# Patient Record
Sex: Male | Born: 1955 | Race: White | Hispanic: No | Marital: Married
Health system: Southern US, Community
[De-identification: ages and names within clinical notes are randomized; demographics above are authoritative.]

---

## 2010-01-23 ENCOUNTER — Ambulatory Visit: Payer: Self-pay

## 2013-03-10 ENCOUNTER — Emergency Department: Payer: Self-pay | Admitting: Emergency Medicine

## 2013-03-10 LAB — CBC
HCT: 47.9 % (ref 40.0–52.0)
MCHC: 34.5 g/dL (ref 32.0–36.0)
RBC: 5.07 10*6/uL (ref 4.40–5.90)
RDW: 13.8 % (ref 11.5–14.5)
WBC: 10.6 10*3/uL (ref 3.8–10.6)

## 2013-03-10 LAB — BASIC METABOLIC PANEL
Anion Gap: 9 (ref 7–16)
BUN: 11 mg/dL (ref 7–18)
Chloride: 100 mmol/L (ref 98–107)
Creatinine: 1.11 mg/dL (ref 0.60–1.30)
EGFR (African American): >60
EGFR (Non-African Amer.): >60
Osmolality: 270 (ref 275–301)
Sodium: 134 mmol/L — ABNORMAL LOW (ref 136–145)

## 2013-03-10 LAB — TROPONIN I
Troponin-I: <0.02 ng/mL
Troponin-I: <0.02 ng/mL

## 2013-03-10 LAB — CK TOTAL AND CKMB (NOT AT ARMC): CK, Total: 98 U/L (ref 35–232)

## 2013-05-10 ENCOUNTER — Emergency Department: Payer: Self-pay | Admitting: Emergency Medicine

## 2013-05-10 LAB — BASIC METABOLIC PANEL
Anion Gap: 12 (ref 7–16)
BUN: 10 mg/dL (ref 7–18)
EGFR (Non-African Amer.): >60
Glucose: 130 mg/dL — ABNORMAL HIGH (ref 65–99)
Potassium: 3.1 mmol/L — ABNORMAL LOW (ref 3.5–5.1)

## 2013-05-10 LAB — CBC
MCH: 32.1 pg (ref 26.0–34.0)
Platelet: 268 10*3/uL (ref 150–440)
RDW: 13.1 % (ref 11.5–14.5)
WBC: 11.7 10*3/uL — ABNORMAL HIGH (ref 3.8–10.6)

## 2013-05-10 LAB — TROPONIN I: Troponin-I: <0.02 ng/mL

## 2014-02-16 IMAGING — CR DG CHEST 2V
1 series · 3 of 3 positions shown · non-contrast
Comparison: none

REASON FOR EXAM: Chest Pain
COMMENTS:

[Series 1: pa · 0.17mm/px · 3 of 3 slices shown]
[im 1/3]
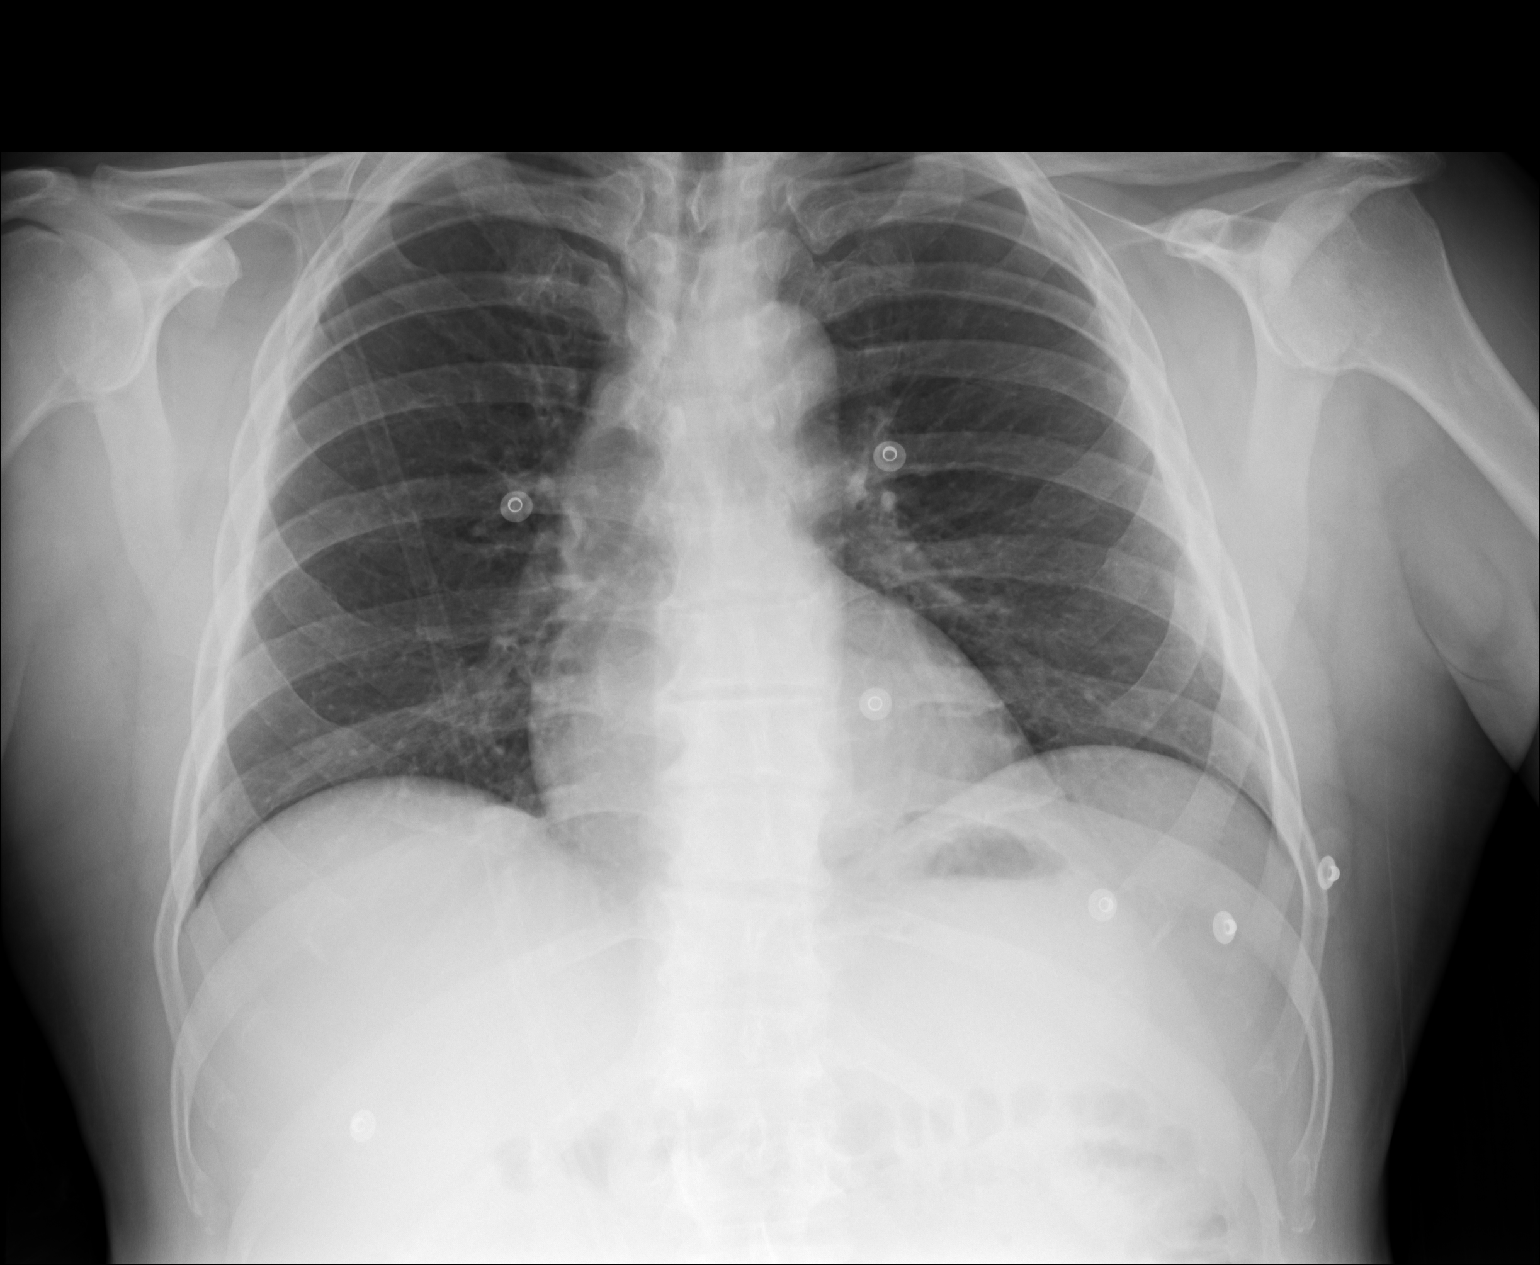
[im 2/3]
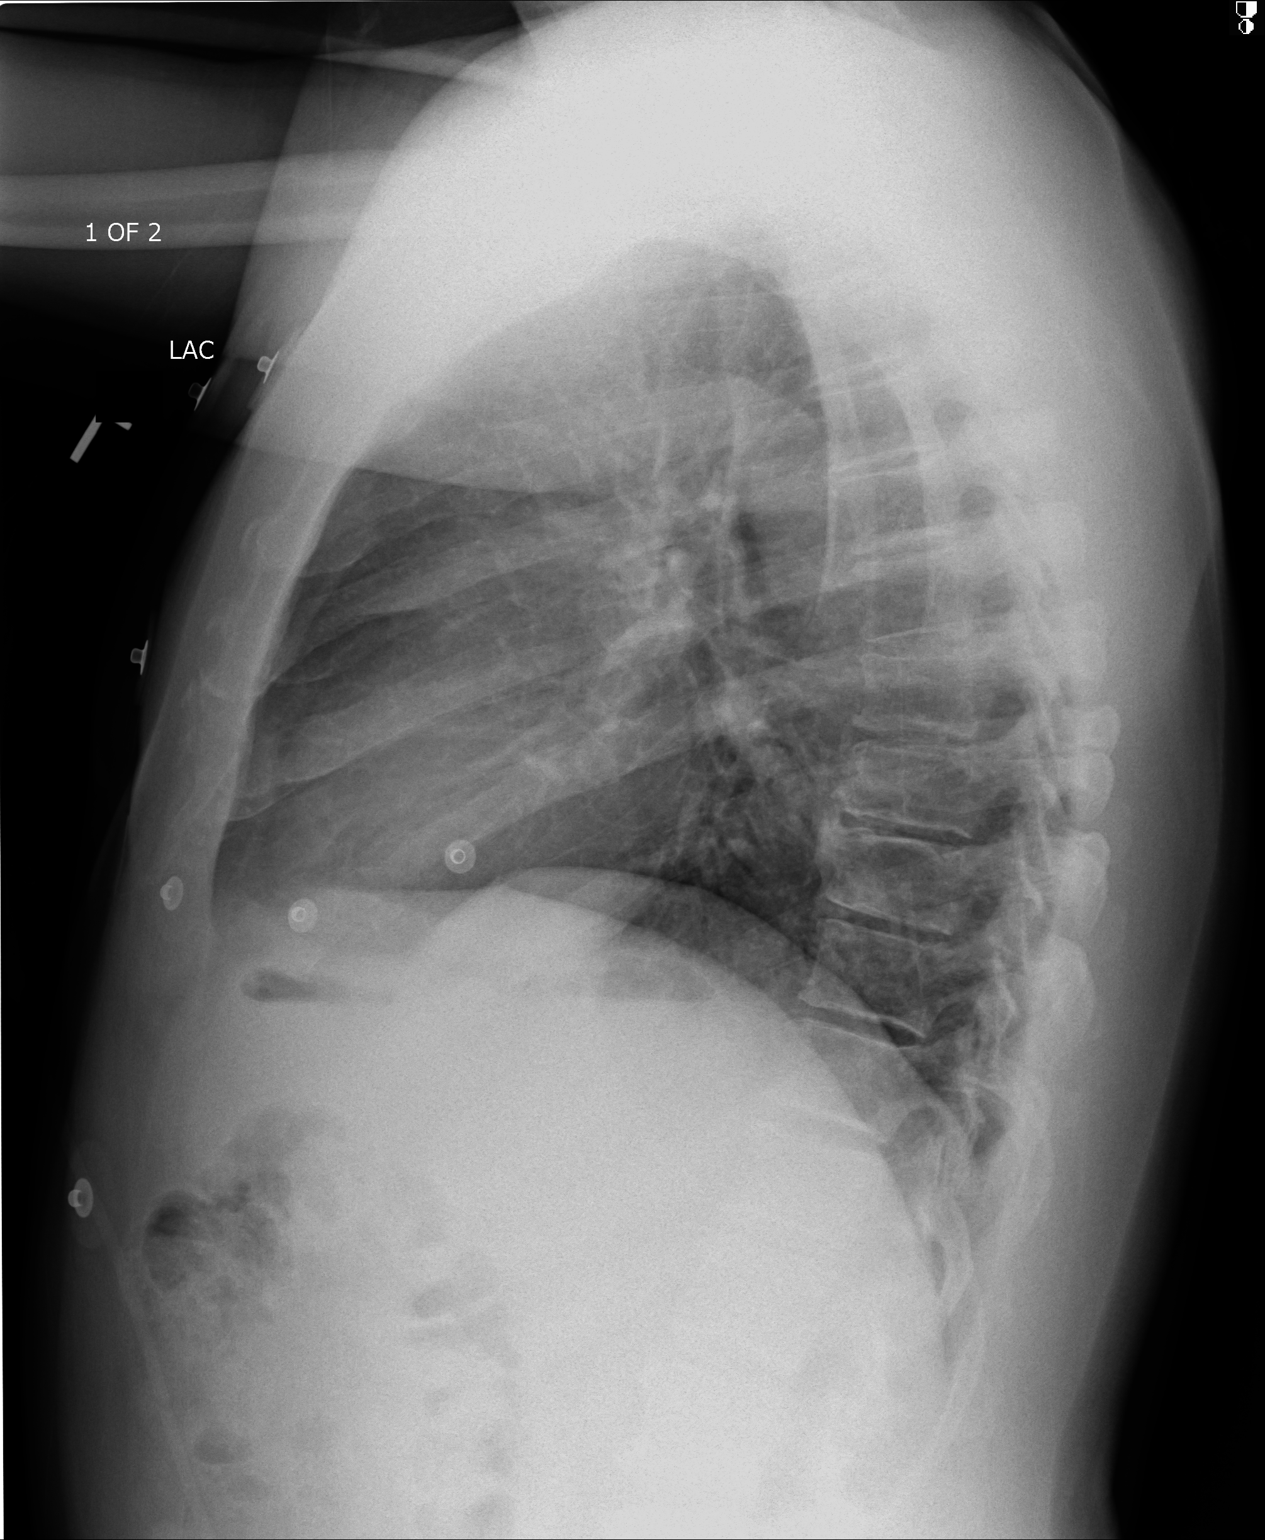
[im 3/3]
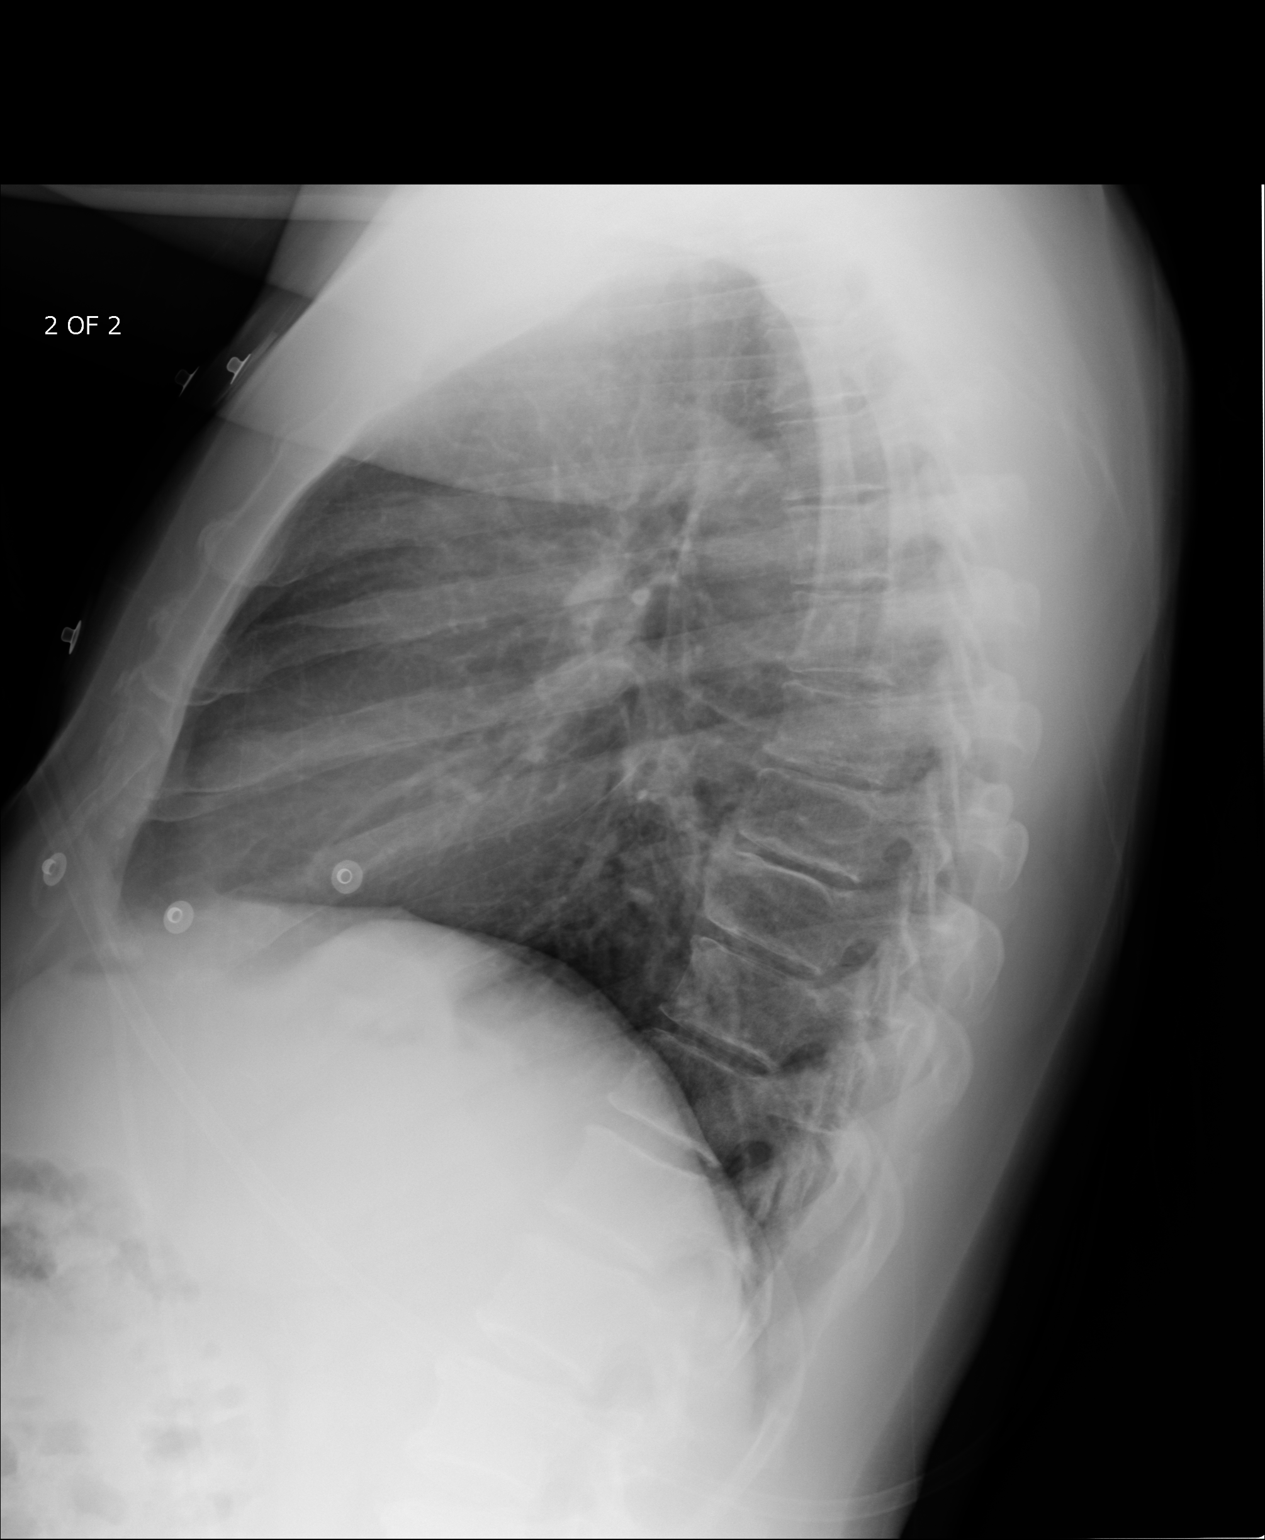

[3 of 3 positions shown; findings below may reference images not displayed]

PROCEDURE:     DXR - DXR CHEST PA (OR AP) AND LATERAL  - March 10, 2013  [DATE]

RESULT:     Comparison is made to the previous study 23 January, 2010. The
lungs are clear. The heart and pulmonary vessels are normal. The bony and
mediastinal structures are unremarkable. There is no effusion. There is no
pneumothorax or evidence of congestive failure.
IMPRESSION: No acute cardiopulmonary disease. Stable appearance.

[REDACTED]

## 2014-04-18 IMAGING — CR DG CHEST 1V PORT
1 series · 1 of 1 positions shown · non-contrast
Comparison: none

REASON FOR EXAM: Chest Pain
COMMENTS:

PROCEDURE:     DXR - DXR PORTABLE CHEST SINGLE VIEW  - May 10, 2013 [DATE]
RESULT:     The lungs are clear. The cardiac silhouette and visualized bony
skeleton are unremarkable.

[ap]
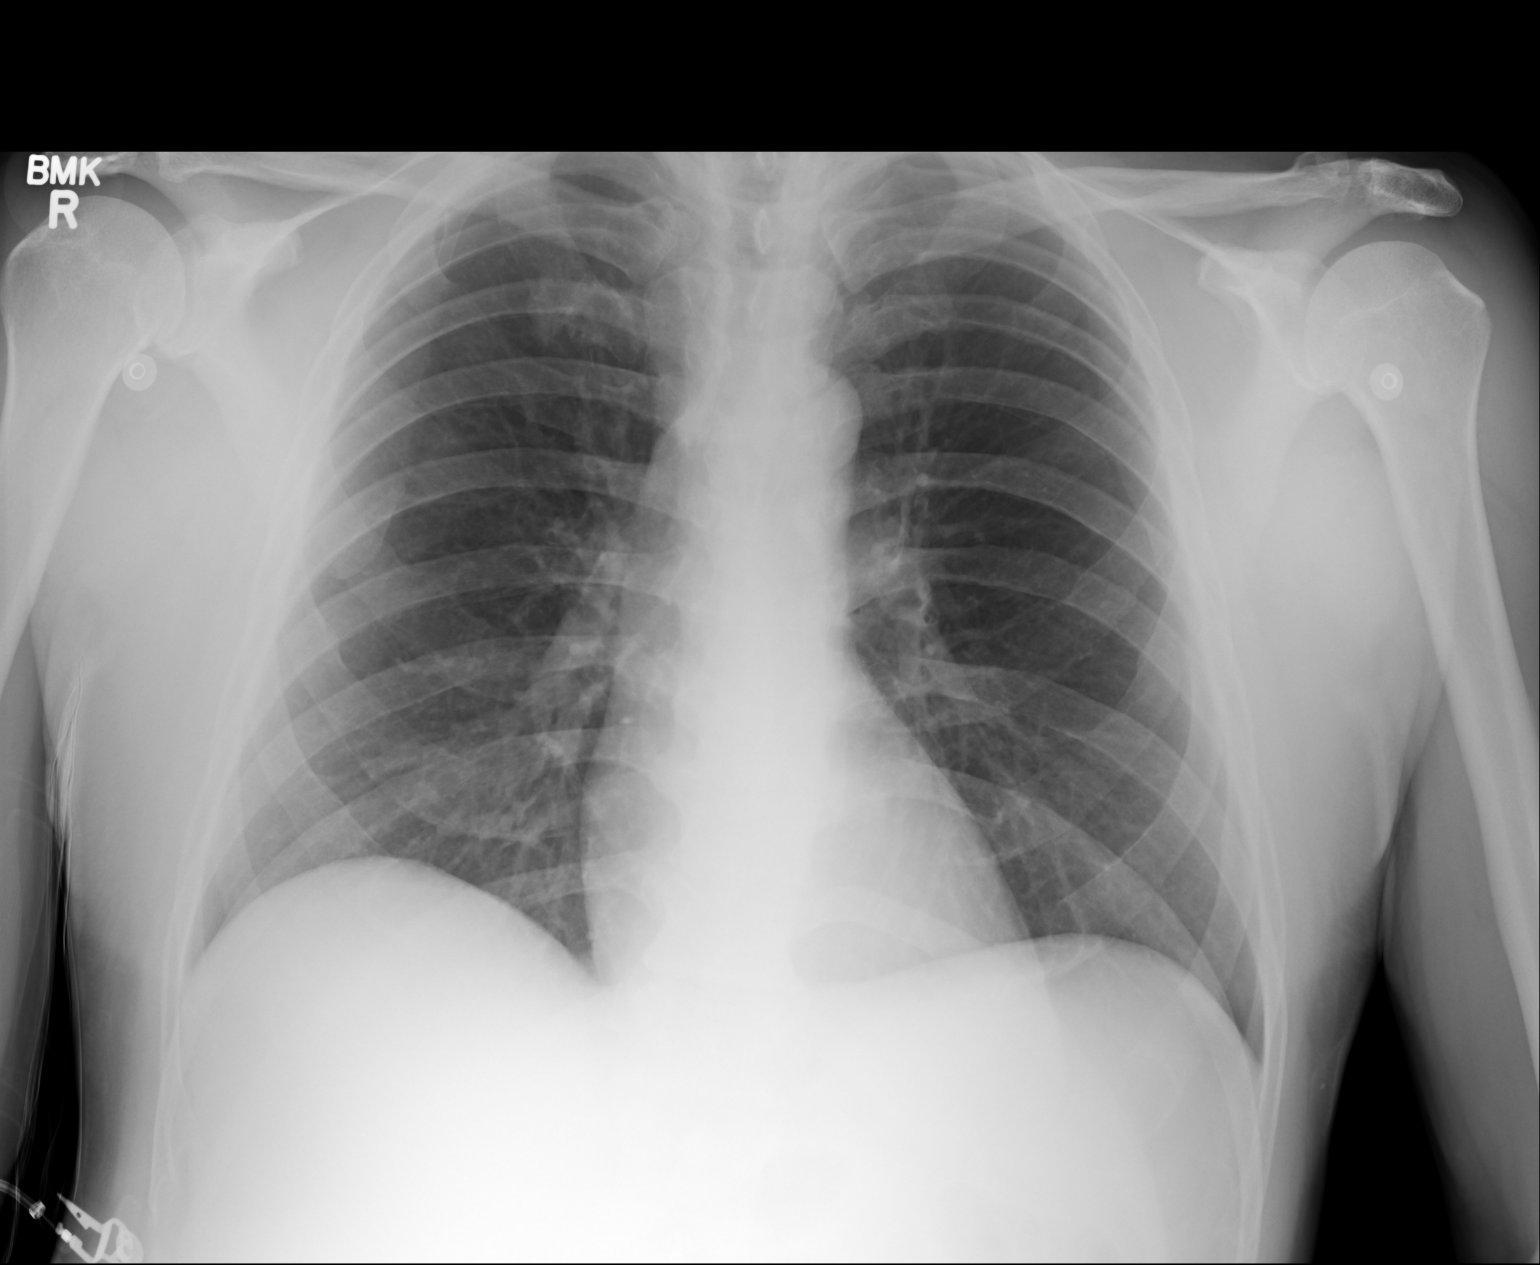

[1 of 1 positions shown; findings below may reference images not displayed]

IMPRESSION: 1. Chest radiograph without evidence of acute cardiopulmonary disease.
2. Comparison made to prior study dated 03/10/2013.
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
## 2023-04-25 ENCOUNTER — Emergency Department
Admission: EM | Admit: 2023-04-25 | Discharge: 2023-04-26 | Discharge status: Against Medical Advice | Payer: Medicare HMO | Attending: Emergency Medicine | Admitting: Emergency Medicine

## 2023-04-25 DIAGNOSIS — R112 Nausea with vomiting, unspecified: Secondary | ICD-10-CM | POA: Diagnosis not present

## 2023-04-25 DIAGNOSIS — Z5321 Procedure and treatment not carried out due to patient leaving prior to being seen by health care provider: Secondary | ICD-10-CM | POA: Insufficient documentation

## 2023-04-25 DIAGNOSIS — R1013 Epigastric pain: Secondary | ICD-10-CM | POA: Insufficient documentation

## 2023-04-25 NOTE — ED Triage Notes (Signed)
Pt presents to ED via EMS with epigastric pain, nausea and vomiting. Per EMS abdominal pain for 2 weeks but today pain increased in the epigastric area with nausea and vomiting.  EMS administered 4mg  Zofran, 18 gauge Lt Ac.  158/90 88

## 2023-04-26 ENCOUNTER — Emergency Department: Payer: Medicare HMO

## 2023-04-26 ENCOUNTER — Other Ambulatory Visit: Payer: Self-pay

## 2023-04-26 LAB — COMPREHENSIVE METABOLIC PANEL
ALT: 13 U/L (ref 0–44)
AST: 20 U/L (ref 15–41)
Albumin: 4.1 g/dL (ref 3.5–5.0)
Alkaline Phosphatase: 98 U/L (ref 38–126)
Anion gap: 9 (ref 5–15)
BUN: 14 mg/dL (ref 8–23)
CO2: 26 mmol/L (ref 22–32)
Calcium: 8.9 mg/dL (ref 8.9–10.3)
Chloride: 102 mmol/L (ref 98–111)
Creatinine, Ser: 1.09 mg/dL (ref 0.61–1.24)
GFR, Estimated: >60 mL/min (ref >60)
Glucose, Bld: 128 mg/dL — ABNORMAL HIGH (ref 70–99)
Potassium: 3.3 mmol/L — ABNORMAL LOW (ref 3.5–5.1)
Sodium: 137 mmol/L (ref 135–145)
Total Bilirubin: 0.6 mg/dL (ref 0.3–1.2)
Total Protein: 7.5 g/dL (ref 6.5–8.1)

## 2023-04-26 LAB — CBC WITH DIFFERENTIAL/PLATELET
Abs Immature Granulocytes: 0.05 10*3/uL (ref 0.00–0.07)
Basophils Absolute: 0.1 10*3/uL (ref 0.0–0.1)
Basophils Relative: 0 %
Eosinophils Absolute: 0.1 10*3/uL (ref 0.0–0.5)
Eosinophils Relative: 1 %
HCT: 37.8 % — ABNORMAL LOW (ref 39.0–52.0)
Hemoglobin: 12.7 g/dL — ABNORMAL LOW (ref 13.0–17.0)
Immature Granulocytes: 0 %
Lymphocytes Relative: 10 %
Lymphs Abs: 1.3 10*3/uL (ref 0.7–4.0)
MCH: 31 pg (ref 26.0–34.0)
MCHC: 33.6 g/dL (ref 30.0–36.0)
MCV: 92.2 fL (ref 80.0–100.0)
Monocytes Absolute: 0.8 10*3/uL (ref 0.1–1.0)
Monocytes Relative: 6 %
Neutro Abs: 10.8 10*3/uL — ABNORMAL HIGH (ref 1.7–7.7)
Neutrophils Relative %: 83 %
Platelets: 232 10*3/uL (ref 150–400)
RBC: 4.1 MIL/uL — ABNORMAL LOW (ref 4.22–5.81)
RDW: 12.7 % (ref 11.5–15.5)
WBC: 13 10*3/uL — ABNORMAL HIGH (ref 4.0–10.5)
nRBC: 0 % (ref 0.0–0.2)

## 2023-04-26 LAB — LIPASE, BLOOD: Lipase: 31 U/L (ref 11–51)

## 2023-04-26 LAB — TROPONIN I (HIGH SENSITIVITY)
Troponin I (High Sensitivity): <2 ng/L (ref <18)
Troponin I (High Sensitivity): 3 ng/L (ref <18)

## 2023-04-26 NOTE — ED Triage Notes (Signed)
EMS Pt presents to ED via EMS with epigastric pain, nausea and vomiting. Per EMS abdominal pain for 2 weeks but today pain increased in the epigastric area with nausea and vomiting.  EMS administered 4mg  Zofran, 18 gauge Lt Ac.  158/90 88

## 2023-04-26 NOTE — ED Notes (Signed)
Pt up to front desk requesting IV be removed so he can leave. Pt informed that it he should wait and see a doctor to talk about results. Pt still wanting to leave. IV taken out at this time.

## 2023-04-28 ENCOUNTER — Telehealth: Payer: Self-pay | Admitting: Emergency Medicine

## 2023-04-28 NOTE — Telephone Encounter (Signed)
Called patient due to left emergency department before provider exam to inquire about condition and follow up plans. No answer at one number and the other has a VM with someone else name on it.
